# Patient Record
Sex: Female | Born: 1960
Health system: Southern US, Community
[De-identification: ages and names within clinical notes are randomized; demographics above are authoritative.]

## PROBLEM LIST (undated history)

## (undated) DIAGNOSIS — E119 Type 2 diabetes mellitus without complications: Secondary | ICD-10-CM

## (undated) HISTORY — DX: Type 2 diabetes mellitus without complications: E11.9

---

## 2006-03-26 ENCOUNTER — Encounter: Payer: Self-pay | Admitting: Pulmonary Disease

## 2007-01-21 ENCOUNTER — Encounter: Payer: Self-pay | Admitting: Pulmonary Disease

## 2007-02-24 ENCOUNTER — Encounter: Payer: Self-pay | Admitting: Pulmonary Disease

## 2007-03-10 ENCOUNTER — Encounter: Payer: Self-pay | Admitting: Pulmonary Disease

## 2007-08-06 ENCOUNTER — Encounter: Payer: Self-pay | Admitting: Pulmonary Disease

## 2007-08-06 ENCOUNTER — Other Ambulatory Visit: Admission: RE | Admit: 2007-08-06 | Discharge: 2007-08-06 | Payer: Self-pay | Admitting: Family Medicine

## 2007-08-26 ENCOUNTER — Encounter: Admission: RE | Admit: 2007-08-26 | Discharge: 2007-08-26 | Payer: Self-pay | Admitting: Emergency Medicine

## 2007-08-26 ENCOUNTER — Encounter: Payer: Self-pay | Admitting: Pulmonary Disease

## 2007-09-13 ENCOUNTER — Ambulatory Visit: Payer: Self-pay | Admitting: Pulmonary Disease

## 2007-09-14 ENCOUNTER — Encounter: Admission: RE | Admit: 2007-09-14 | Discharge: 2007-09-14 | Payer: Self-pay | Admitting: Family Medicine

## 2008-03-27 ENCOUNTER — Telehealth (INDEPENDENT_AMBULATORY_CARE_PROVIDER_SITE_OTHER): Payer: Self-pay | Admitting: *Deleted

## 2008-03-29 ENCOUNTER — Ambulatory Visit: Payer: Self-pay | Admitting: Pulmonary Disease

## 2008-03-29 LAB — CONVERTED CEMR LAB
BUN: 13 mg/dL (ref 6–23)
CO2: 23 meq/L (ref 19–32)
Chloride: 100 meq/L (ref 96–112)
GFR calc non Af Amer: 96 mL/min
Glucose, Bld: 262 mg/dL — ABNORMAL HIGH (ref 70–99)
Potassium: 4.3 meq/L (ref 3.5–5.1)
Sodium: 135 meq/L (ref 135–145)

## 2008-03-30 ENCOUNTER — Telehealth (INDEPENDENT_AMBULATORY_CARE_PROVIDER_SITE_OTHER): Payer: Self-pay | Admitting: *Deleted

## 2008-03-30 ENCOUNTER — Ambulatory Visit: Payer: Self-pay | Admitting: Cardiology

## 2008-04-04 ENCOUNTER — Ambulatory Visit: Payer: Self-pay | Admitting: Pulmonary Disease

## 2008-04-04 DIAGNOSIS — E119 Type 2 diabetes mellitus without complications: Secondary | ICD-10-CM | POA: Insufficient documentation

## 2008-04-04 DIAGNOSIS — R599 Enlarged lymph nodes, unspecified: Secondary | ICD-10-CM | POA: Insufficient documentation

## 2008-04-05 ENCOUNTER — Telehealth (INDEPENDENT_AMBULATORY_CARE_PROVIDER_SITE_OTHER): Payer: Self-pay | Admitting: *Deleted

## 2008-06-30 ENCOUNTER — Ambulatory Visit (HOSPITAL_COMMUNITY): Admission: RE | Admit: 2008-06-30 | Discharge: 2008-06-30 | Payer: Self-pay | Admitting: Surgery

## 2008-08-01 ENCOUNTER — Ambulatory Visit (HOSPITAL_COMMUNITY): Admission: RE | Admit: 2008-08-01 | Discharge: 2008-08-01 | Payer: Self-pay | Admitting: Surgery

## 2008-08-07 ENCOUNTER — Other Ambulatory Visit: Admission: RE | Admit: 2008-08-07 | Discharge: 2008-08-07 | Payer: Self-pay | Admitting: Family Medicine

## 2008-08-23 ENCOUNTER — Ambulatory Visit (HOSPITAL_COMMUNITY): Admission: RE | Admit: 2008-08-23 | Discharge: 2008-08-23 | Payer: Self-pay | Admitting: Surgery

## 2008-08-23 ENCOUNTER — Encounter (INDEPENDENT_AMBULATORY_CARE_PROVIDER_SITE_OTHER): Payer: Self-pay | Admitting: Surgery

## 2008-12-27 IMAGING — CT CT CHEST W/ CM
3 series · 18 of 29 positions shown, 19 images · IV contrast (75CC OMNI 300)
Comparison: Outside CT dated 02/24/07.

CLINICAL DATA: Hilar and mediastinal adenopathy.  Follow-up adenopathy.
CT CHEST WITH CONTRAST:
TECHNIQUE: Multidetector CT imaging of the chest was performed following the standard protocol during bolus administration of intravenous contrast.
Contrast:  75cc Omnipaque 300

[Series 2: routine chest · axial · 0.70mm/px · z∈[-158,-38]mm · 4 of 50 slices shown, 5 images]
[im 13/50  mediastinal]
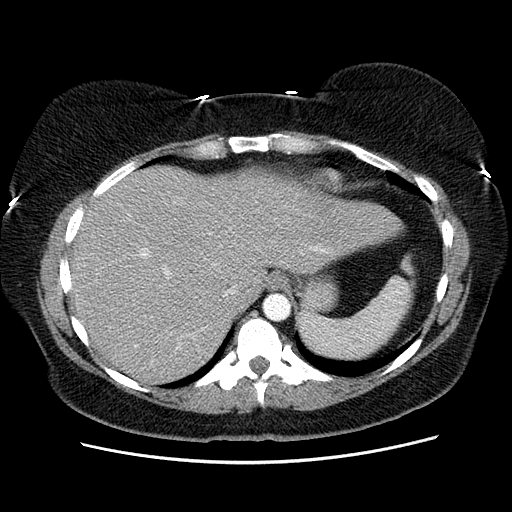
[im 13/50  lung]
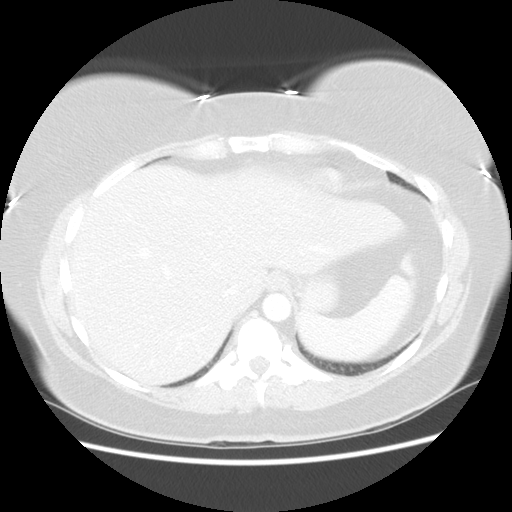
[im 25/50  lung]
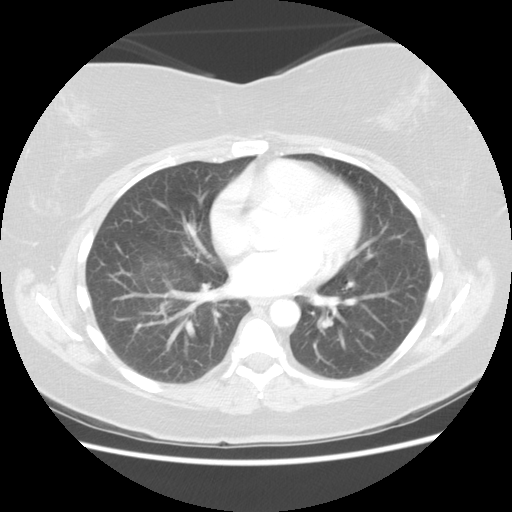
[im 30/50  lung]
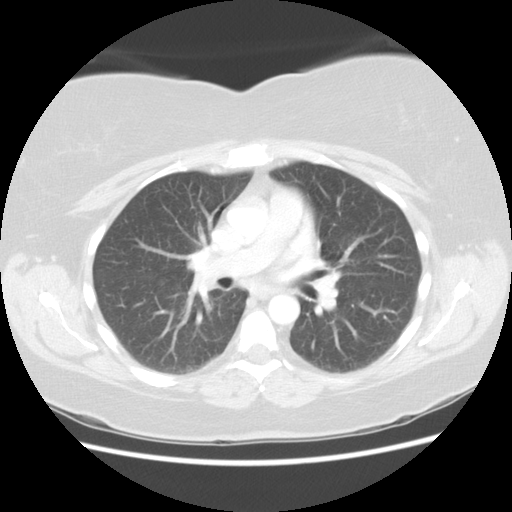
[im 37/50  lung]
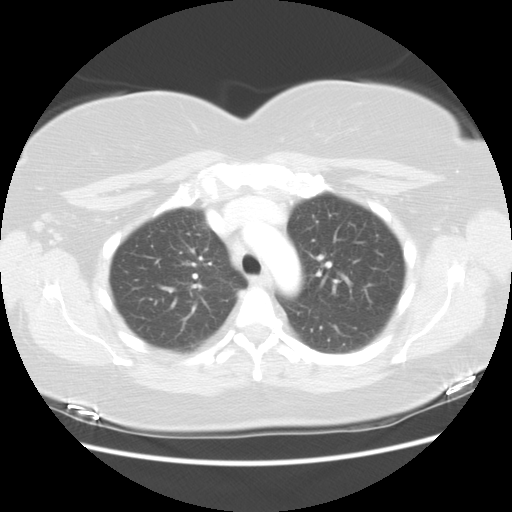

[Series 400: coronal · coronal · 0.70mm/px · 6 of 105 slices shown]
[im 12/105  lung]
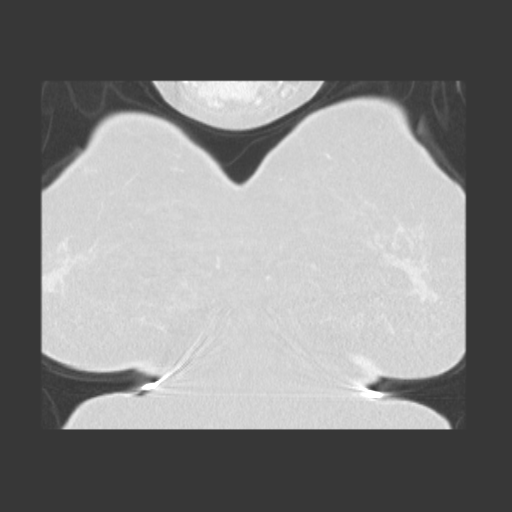
[im 24/105  lung]
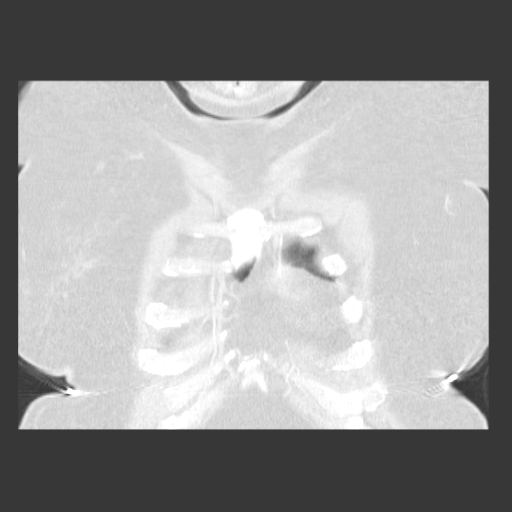
[im 35/105  lung]
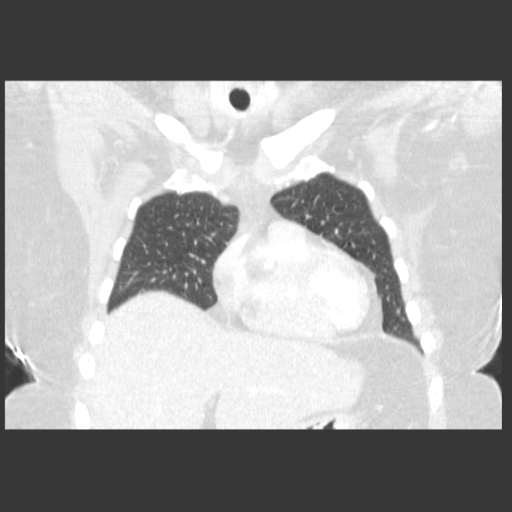
[im 47/105  lung]
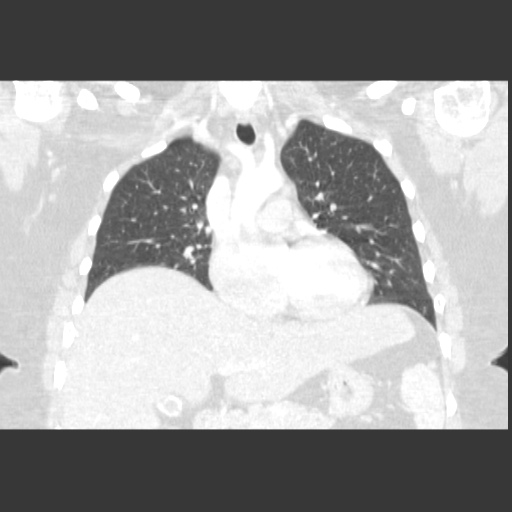
[im 58/105  lung]
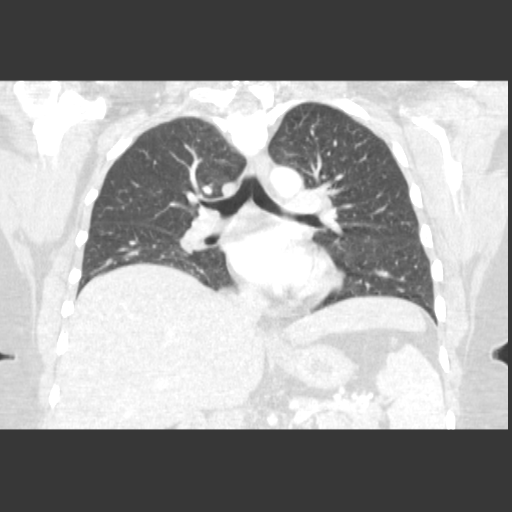
[im 70/105  lung]
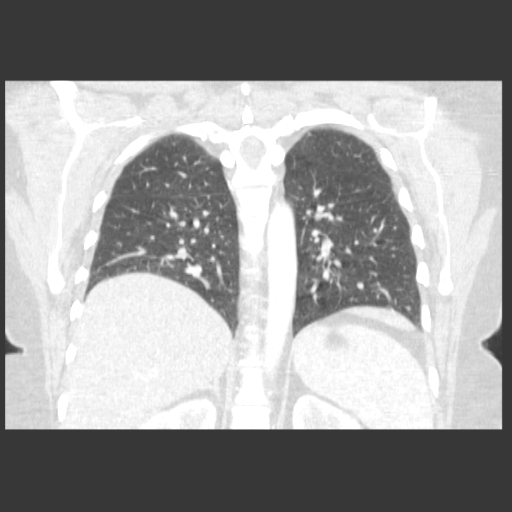

[Series 401: sagittal · sagittal · 0.70mm/px · 8 of 143 slices shown]
[im 12/143  lung]
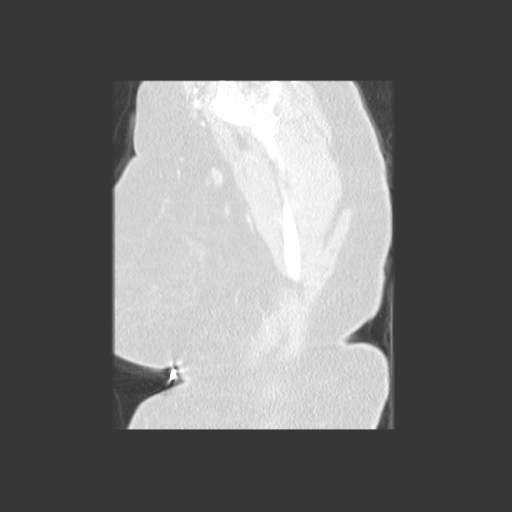
[im 36/143  lung]
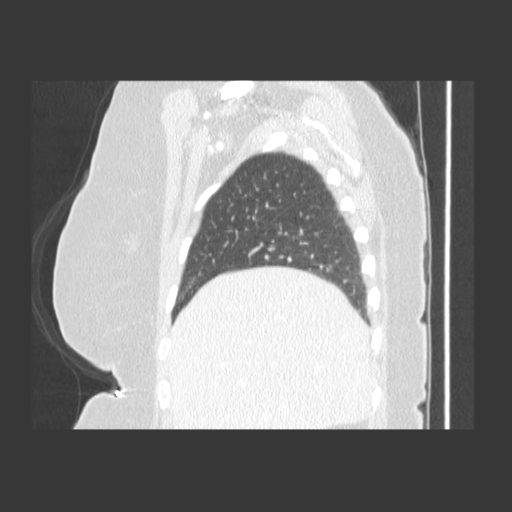
[im 48/143  lung]
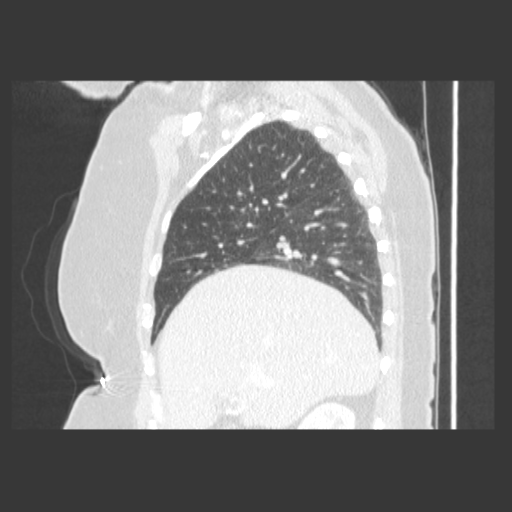
[im 60/143  lung]
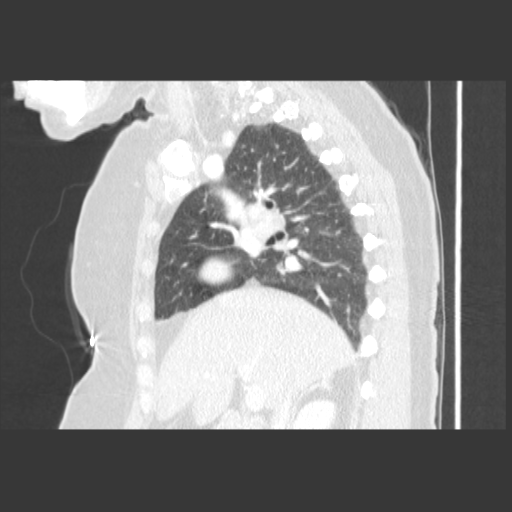
[im 83/143  lung]
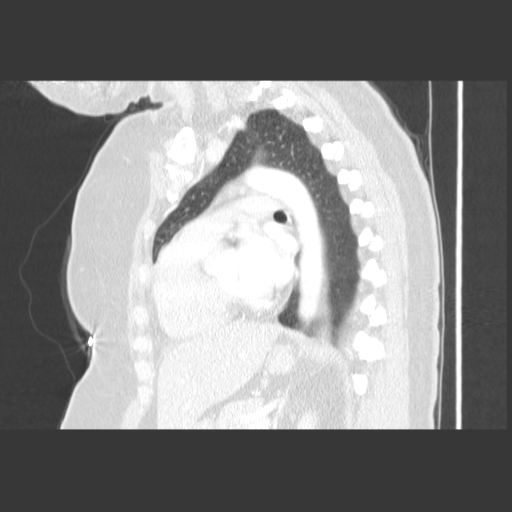
[im 95/143  lung]
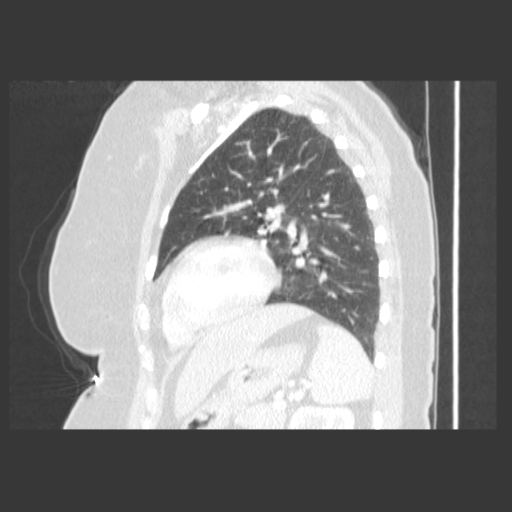
[im 107/143  lung]
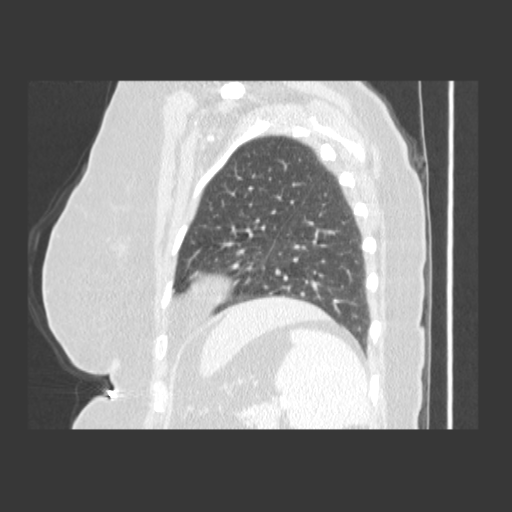
[im 131/143  lung]
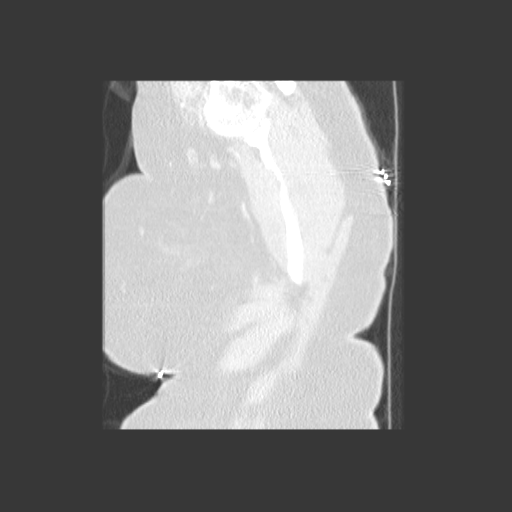

[18 of 29 positions shown; findings below may reference images not displayed]

FINDINGS: No pathologically enlarged axillary lymph nodes are identified.
There are no enlarged supraclavicular lymph nodes.  
Scattered borderline enlarged mediastinal lymph nodes are again identified.
For example, pretracheal lymph node measures 11.6 x 17.0 mm, image 18.  This is compared with 10 x 18 mm previously.
Prevascular lymph node measures 8 mm x 11.5 mm.  This is unchanged.
Right hilar lymph node measures 17.4 x 14.6 mm, image 24.  This is compared with 19 x 15 mm previously.
No pericardial or pleural fluid.
No suspicious pulmonary nodules or masses are noted. 
Review of the bone windows shows no suspicious lytic or sclerotic lesions.  Probable benign bone island is identified within the sternum, image 26.
Imaging through the upper abdomen shows gallstones.
IMPRESSION: 1.  Stable mediastinal and hilar adenopathy.  The differential diagnosis includes granulomatous disease such as sarcoid or infectious lymphadenopathy.  A neoplastic process such as lymphoma or metastatic disease is considered less favored, but not excluded. 
2.  Cholelithiasis.

## 2009-09-27 ENCOUNTER — Other Ambulatory Visit: Admission: RE | Admit: 2009-09-27 | Discharge: 2009-09-27 | Payer: Self-pay | Admitting: Family Medicine

## 2009-11-30 ENCOUNTER — Encounter: Admission: RE | Admit: 2009-11-30 | Discharge: 2009-11-30 | Payer: Self-pay | Admitting: Family Medicine

## 2010-10-08 ENCOUNTER — Other Ambulatory Visit
Admission: RE | Admit: 2010-10-08 | Discharge: 2010-10-08 | Payer: Self-pay | Source: Home / Self Care | Admitting: Family Medicine

## 2010-10-27 ENCOUNTER — Encounter: Payer: Self-pay | Admitting: Pulmonary Disease

## 2011-02-18 NOTE — Assessment & Plan Note (Signed)
[facility]                             PULMONARY OFFICE NOTE   Monica Maynard, Monica Maynard                          MRN:          [id number]  DATE:09/13/2007                            DOB:          13-Feb-1961    HISTORY OF PRESENT ILLNESS:  Monica Maynard is a 50 year old Caucasian woman  who is referred for evaluation of mediastinal lymphadenopathy.  She felt  sudden onset fatigue, myalgias, and swelling of the left ankle and lower  extremity.  Rheumatologist and vascular surgeon were consulted.  Workup  showed an ANA of 1 and 1280, but all other serologies were negative.  Duplex left lower extremity did not reveal any venous thrombosis.  CT  angiogram of the abdomen and pelvis did not show any evidence of  lymphadenopathy, but incidentally picked up lymphadenopathy in her  chest.  This was confirmed by a CT of the chest in May, 2008, which  showed mediastinal and bilateral hilar lymphadenopathy.  She saw a  pulmonologist.  Pulmonary function tests were essentially normal.  Of  note, due to abnormal chest x-ray in June 2007, she had undergone a  chest CT, which did not mention enlarged lymph nodes.  I have reviewed  these reports.   Monica Maynard, at present, does not have any symptoms of dyspnea, wheezing,  cough.  She denied joint pains.  Swelling in her left lower extremity  seemed to subside spontaneously.  She did not develop any skin rash,  fevers, weight loss, loss of appetites, night sweats.  She does not have  any family history of autoimmune disease.   PAST MEDICAL HISTORY:  Includes type 2 diabetes.   PAST SURGICAL HISTORY:  Bilateral carpal tunnel syndrome in 1994 and  1995.  Ankle ligament repair.  2 spontaneous vaginal deliveries.   ALLERGIES:  PROVERA caused a rash.   CURRENT MEDICATIONS:  Metformin.   SOCIAL HISTORY:  She quit smoking in 1982, smoked for about 4 years  about 5 to 6 cigarettes a day.  She is married and lives with her  husband.   She used to live in [place] before moving to [place].   REVIEW OF SYSTEMS:  Reports a dry, nonproductive cough.  Denies joint  pain, swelling.  Reports occasional stiffness in her joints.  Otherwise,  as above.   FAMILY HISTORY:  Mom is 2 and has severe COPD.  Father is 73 with early  signs of dementia.  1 sister has fibromyalgia.  Strong family history of  heart disease in paternal second degree relatives.   PHYSICAL EXAM:  Height 5 feet 3 inches, weight 185 pounds, temperature  98.3, blood pressure 112/70.  Heart rate 82 per minute, oxygen  saturation 97% on room air.  HEENT:  No post-nasal drip.  NECK:  Supple.  No JVD.  No lymphadenopathy.  CVS:  S1, S2 normal.  CHEST:  Clear to auscultation.  ABDOMEN:  Soft and nontender.  NEUROLOGIC:  Nonfocal.  EXTREMITIES:  No edema.   IMPRESSION:  1. Mediastinal lymphadenopathy of unclear etiology, stable since May  2008, but new compared to CT in June 2007.  2. Positive ANA.   The differential diagnoses of dyspnea with mediastinal lymphadenopathy  is broad and includes inflammatory etiology, such as collagen vascular  disease or granulomatous disease, such as sarcoidosis.  Malignancy seems  very unlikely in this nonsmoker.  I also noted that there was no  associated abdominal lymphadenopathy.  An ACE level is normal.  I would  favor an inflammatory etiology given the positive ANA and the joint  symptoms in the past.   RECOMMENDATIONS:  1. Followup CT test will be scheduled in 6 months' time.  If there is      any enlargement of the lymph nodes, we would pursue a      mediastinoscopy with biopsy.  Otherwise, we will just elect to      watch this.  2. Would be interesting to repeat an ANA to see if this has also      normalized with the resolution of her joint symptoms.  It seems      that a rheumatologic diagnosis was not established, but she had      pretty intensive workup and all of her serologies were normal, sed       rate and complement levels were normal.  She will return for a      followup CT scan in 6 months' time.  She will call us earlier if      symptoms develop.     Oretha Milch, MD  Electronically Signed    RVA/MedQ  DD: 09/13/2007  DT: 09/13/2007  Job #: [id number]   cc:   Carilyn Goodpasture, PA

## 2011-02-18 NOTE — Op Note (Signed)
NAMEKHRISTEN, CHEYNEY NO.:  [id number]   MEDICAL RECORD NO.:  [id number]          PATIENT TYPE:  AMB   LOCATION:  SDS                          FACILITY:  [facility]   PHYSICIAN:  Currie Paris, M.D.DATE OF BIRTH:  12/30/1960   DATE OF PROCEDURE:  08/23/2008  DATE OF DISCHARGE:  08/23/2008                               OPERATIVE REPORT   MEDICAL RECORD NUMBER [id number].   PREOPERATIVE DIAGNOSIS:  Cholelithiasis with apparent porcelain  gallbladder.   POSTOPERATIVE DIAGNOSIS:  Cholelithiasis with apparent porcelain  gallbladder.   OPERATION:  Laparoscopic cholecystectomy with operative cholangiogram.   SURGEON:  Currie Paris, MD   ASSISTANT:  Anselm Pancoast. Zachery Dakins, MD   ANESTHESIA:  General endotracheal.   CLINICAL HISTORY:  This is a 50 year old lady with gallstones and what  appeared to be a porcelain gallbladder and known diabetes.  She was  scheduled for cholecystectomy.   DESCRIPTION OF PROCEDURE:  The patient was seen in the holding area and  we reviewed the plans for surgery one more time.  She had no further  questions and was prepared to proceed.   The patient was taken to the operating room.  After satisfactory general  endotracheal anesthesia had been obtained, the abdomen was prepped and  draped.  A time-out was performed.   The area on the umbilicus was anesthetized with 0.25% plain Marcaine and  the umbilical incision made.  The fascia was identified, opened, a  pursestring placed, and the Hasson introduced under direct vision.  The  abdomen was insufflated to 15 and the camera placed.  There was no  obvious evidence of any abnormalities other than that the gallbladder  appeared somewhat contracted.   Using local for extend of our incision, a 10/11 trocar was placed in the  epigastrium and 5-mm trocars laterally.   The gallbladder was retracted over the liver, the perineum over the  cystic duct opened, and I dissected out a nice  long segment of cystic  duct.  There was a fair amount of chronic inflammation here and I did  not dissect into the cystic artery but made sure we had a nice long  section of cystic duct and could clearly see its junction with the  gallbladder as well as a cyst duct lymph node.   The duct was clipped once at the junction of the gallbladder and opened.  A Cook catheter was introduced percutaneously and intraoperative  angiogram done, which showed a normal common duct with good filling of  the duodenum and filling of the hepatic radicals.   Cystic duct catheter was removed and 3 clips placed on the stay side of  the cystic duct and it was divided.  The artery was dissected out,  clipped, and divided and the posterior branch was then dissected out,  clipped, and divided.  The gallbladder was then removed from below to  above with coagulation current of the cautery.  Once it was  disconnected, I irrigated and made sure everything was dry.  The  gallbladder was placed in a bag and brought out through  the umbilical  port.  I did leave a little Surgicel along the bed of the gallbladder.   After that was done, the lateral ports were removed and we made sure  there was no bleeding.  The umbilical port was removed and the  pursestring tied down.  The abdomen was desufflated through the  epigastric port and the skin then closed with 4-0 Monocryl subcuticular  plus Dermabond.   The patient tolerated the procedure well.  There were no complications.  All counts were correct.      Currie Paris, M.D.  Electronically Signed     CJS/MEDQ  D:  08/23/2008  T:  08/23/2008  Job:  [id number]   cc:   Otilio Connors. Gerri Spore, M.D.

## 2011-07-07 LAB — CBC
MCHC: 35
Platelets: 183
RBC: 4.53
RDW: 12.8

## 2011-07-07 LAB — DIFFERENTIAL
Basophils Absolute: 0
Basophils Relative: 1
Monocytes Absolute: 0.4
Monocytes Relative: 6
Neutro Abs: 3.7
Neutrophils Relative %: 61

## 2011-07-07 LAB — COMPREHENSIVE METABOLIC PANEL
ALT: 28
AST: 31
Albumin: 3.3 — ABNORMAL LOW
Alkaline Phosphatase: 57
BUN: 13
Chloride: 101
GFR calc Af Amer: >60
Sodium: 136
Total Bilirubin: 0.6

## 2011-07-07 LAB — LIPASE, BLOOD: Lipase: 24

## 2011-07-08 LAB — COMPREHENSIVE METABOLIC PANEL
ALT: 30
Albumin: 3.5
Albumin: 3.2 — ABNORMAL LOW
Alkaline Phosphatase: 47
CO2: 28
Calcium: 9.3
Chloride: 102
Creatinine, Ser: 0.67
Creatinine, Ser: 0.6
GFR calc Af Amer: >60
GFR calc non Af Amer: >60
Glucose, Bld: 139 — ABNORMAL HIGH
Potassium: 4.4
Total Protein: 6

## 2011-07-08 LAB — URINALYSIS, ROUTINE W REFLEX MICROSCOPIC
Bilirubin Urine: NEGATIVE
Glucose, UA: NEGATIVE
Hgb urine dipstick: NEGATIVE
Ketones, ur: 15 — AB
Ketones, ur: 15 — AB
Leukocytes, UA: NEGATIVE
Nitrite: NEGATIVE
Specific Gravity, Urine: 1.023
Specific Gravity, Urine: 1.024
Urobilinogen, UA: 0.2
pH: 5.5

## 2011-07-08 LAB — DIFFERENTIAL
Eosinophils Absolute: 0.1
Eosinophils Relative: 1
Eosinophils Relative: 1
Lymphs Abs: 2.5
Lymphs Abs: 2
Monocytes Absolute: 0.4
Monocytes Relative: 6
Neutro Abs: 4.3
Neutrophils Relative %: 59

## 2011-07-08 LAB — LIPASE, BLOOD: Lipase: 26

## 2011-07-08 LAB — URINE MICROSCOPIC-ADD ON

## 2011-07-08 LAB — CBC
HCT: 43.8
HCT: 43.1
Hemoglobin: 15.3 — ABNORMAL HIGH
MCHC: 34.6
MCV: 94.1
Platelets: 207
RDW: 12.1
WBC: 7.3

## 2011-07-08 LAB — GLUCOSE, CAPILLARY: Glucose-Capillary: 143 — ABNORMAL HIGH

## 2011-10-27 ENCOUNTER — Other Ambulatory Visit: Payer: Self-pay | Admitting: Family Medicine

## 2011-10-27 ENCOUNTER — Other Ambulatory Visit (HOSPITAL_COMMUNITY)
Admission: RE | Admit: 2011-10-27 | Discharge: 2011-10-27 | Disposition: A | Discharge status: Home / Self Care | Payer: Commercial Indemnity | Source: Ambulatory Visit | Attending: Family Medicine | Admitting: Family Medicine

## 2011-10-27 DIAGNOSIS — Z1159 Encounter for screening for other viral diseases: Secondary | ICD-10-CM | POA: Insufficient documentation

## 2011-10-27 DIAGNOSIS — Z124 Encounter for screening for malignant neoplasm of cervix: Secondary | ICD-10-CM | POA: Insufficient documentation

## 2012-01-23 ENCOUNTER — Other Ambulatory Visit: Payer: Self-pay | Admitting: Gastroenterology

## 2012-11-03 ENCOUNTER — Other Ambulatory Visit: Payer: Self-pay | Admitting: Family Medicine

## 2012-11-03 DIAGNOSIS — N6315 Unspecified lump in the right breast, overlapping quadrants: Secondary | ICD-10-CM

## 2012-11-16 ENCOUNTER — Ambulatory Visit
Admission: RE | Admit: 2012-11-16 | Discharge: 2012-11-16 | Disposition: A | Discharge status: Home / Self Care | Payer: Commercial Indemnity | Source: Ambulatory Visit | Attending: Family Medicine | Admitting: Family Medicine

## 2012-11-16 DIAGNOSIS — N6315 Unspecified lump in the right breast, overlapping quadrants: Secondary | ICD-10-CM

## 2013-05-18 ENCOUNTER — Other Ambulatory Visit: Payer: Self-pay | Admitting: Family Medicine

## 2013-05-18 DIAGNOSIS — R202 Paresthesia of skin: Secondary | ICD-10-CM

## 2013-05-19 ENCOUNTER — Ambulatory Visit
Admission: RE | Admit: 2013-05-19 | Discharge: 2013-05-19 | Disposition: A | Discharge status: Home / Self Care | Payer: Commercial Indemnity | Source: Ambulatory Visit | Attending: Family Medicine | Admitting: Family Medicine

## 2013-05-19 DIAGNOSIS — R202 Paresthesia of skin: Secondary | ICD-10-CM

## 2014-10-13 ENCOUNTER — Ambulatory Visit: Payer: BC Managed Care – PPO

## 2014-12-01 ENCOUNTER — Encounter: Payer: BLUE CROSS/BLUE SHIELD | Attending: Family Medicine | Admitting: *Deleted

## 2014-12-01 ENCOUNTER — Encounter: Payer: Self-pay | Admitting: *Deleted

## 2014-12-01 VITALS — Ht 62.5 in | Wt 168.2 lb

## 2014-12-01 DIAGNOSIS — Z713 Dietary counseling and surveillance: Secondary | ICD-10-CM | POA: Diagnosis present

## 2014-12-01 DIAGNOSIS — Z8632 Personal history of gestational diabetes: Secondary | ICD-10-CM | POA: Insufficient documentation

## 2014-12-01 DIAGNOSIS — E118 Type 2 diabetes mellitus with unspecified complications: Secondary | ICD-10-CM

## 2014-12-01 DIAGNOSIS — E119 Type 2 diabetes mellitus without complications: Secondary | ICD-10-CM | POA: Insufficient documentation

## 2014-12-01 DIAGNOSIS — E282 Polycystic ovarian syndrome: Secondary | ICD-10-CM | POA: Insufficient documentation

## 2014-12-06 NOTE — Patient Instructions (Signed)
Plan:  Aim for 2 Carb Choices per meal (30 grams) +/- 1 either way  Aim for 0-1 Carbs per snack if hungry  Include protein in moderation with your meals and snacks Consider reading food labels for Total Carbohydrate of foods Consider  increasing your activity level daily as tolerated Consider checking BG at alternate times per day   Consider taking medication as directed by MD

## 2014-12-06 NOTE — Progress Notes (Signed)
Diabetes Self-Management Education  Visit Type:  Initial  Appt. Start Time: 1030 Appt. End Time: 1200  12/06/2014  [name], identified by name and date of birth, is a 54 y.o. female with a diagnosis of Diabetes: Type 2 (States history of GDM with 2 pregnancies and now diagnosed with PCOS.).  Other people present during visit:  Patient   ASSESSMENT  Height 5' 2.5" (1.588 m), weight 168 lb 3.2 oz (76.295 kg). Body mass index is 30.25 kg/(m^2).  Initial Visit Information:  Are you currently following a meal plan?: Yes What type of meal plan do you follow?: Balanced meals, 3 meals a day with occasional snacks Are you taking your medications as prescribed?: Yes Are you checking your feet?: Yes How many days per week are you checking your feet?: 7 How often do you need to have someone help you when you read instructions, pamphlets, or other written materials from your doctor or pharmacy?: 1 - Never    Psychosocial:     Patient Belief/Attitude about Diabetes: Defeat/Burnout Self-management support: Doctor's office Other persons present: Patient Preferred Learning Style: Visual, Hands on Learning Readiness: Change in progress  Complications:   Last HgB A1C per patient/outside source: 7.2 mg/dL How often do you check your blood sugar?: 3-4 times/day Number of hypoglycemic episodes per month: 0 Have you had a dilated eye exam in the past 12 months?: Yes Have you had a dental exam in the past 12 months?: Yes  Diet Intake:  Breakfast: scrambled egg, bacon, whole wheat English Muffin, butter, All Fruit spread, coffee with Splenda OR 1/2 biscuit on way to work with ham or egg and cheese Snack (morning): not usually, maybe raw vegetables OR trail mix without any fruit Lunch: brings from home, meat and cheese sandwich on whole wheat bread, raw vegetables, water or diet soda Snack (afternoon): glass of skim milk when home from work Dinner: [employer], vegetables and a starch OR  casserole type meal OR grilled cheese Snack (evening): not usually Beverage(s): coffee, water or diet soda  Exercise:  Exercise: Light (walking / raking leaves) Light Exercise amount of time (min / week): 30  Individualized Plan for Diabetes Self-Management Training:   Learning Objective:  Patient will have a greater understanding of diabetes self-management.  Patient education plan per assessed needs and concerns is to attend individual sessions for     Education Topics Reviewed with Patient Today:  Definition of diabetes, type 1 and 2, and the diagnosis of diabetes Role of diet in the treatment of diabetes and the relationship between the three main macronutrients and blood glucose level, Carbohydrate counting Role of exercise on diabetes management, blood pressure control and cardiac health. Reviewed patients medication for diabetes, action, purpose, timing of dose and side effects., Other (comment) (also discussed insulin action and advantages of insulin over some medications that have potentially significant side effects) Identified appropriate SMBG and/or A1C goals.     Role of stress on diabetes      PATIENTS GOALS/Plan (Developed by the patient):  Nutrition: Follow meal plan discussed Physical Activity: 15 minutes per day Medications: take my medication as prescribed, Other (comment) (Discuss options with your MD)  Plan:   Patient Instructions  Plan:  Aim for 2 Carb Choices per meal (30 grams) +/- 1 either way  Aim for 0-1 Carbs per snack if hungry  Include protein in moderation with your meals and snacks Consider reading food labels for Total Carbohydrate of foods Consider  increasing your activity level  daily as tolerated Consider checking BG at alternate times per day   Consider taking medication as directed by MD      Expected Outcomes:  Demonstrated interest in learning. Expect positive outcomes  Education material provided: Living Well with Diabetes,  A1C conversion sheet, Meal plan card and Support group flyer, DM Medication Handout  If problems or questions, patient to contact team via:  Phone and Email  Future DSME appointment: 3-4 months

## 2014-12-29 ENCOUNTER — Ambulatory Visit: Payer: Commercial Indemnity | Admitting: *Deleted

## 2015-01-05 ENCOUNTER — Ambulatory Visit: Payer: Commercial Indemnity | Admitting: *Deleted

## 2015-01-11 ENCOUNTER — Encounter: Payer: BLUE CROSS/BLUE SHIELD | Attending: Family Medicine | Admitting: *Deleted

## 2015-01-11 VITALS — Ht 62.5 in | Wt 167.5 lb

## 2015-01-11 DIAGNOSIS — E282 Polycystic ovarian syndrome: Secondary | ICD-10-CM | POA: Diagnosis not present

## 2015-01-11 DIAGNOSIS — Z8632 Personal history of gestational diabetes: Secondary | ICD-10-CM | POA: Insufficient documentation

## 2015-01-11 DIAGNOSIS — Z713 Dietary counseling and surveillance: Secondary | ICD-10-CM | POA: Insufficient documentation

## 2015-01-11 DIAGNOSIS — E119 Type 2 diabetes mellitus without complications: Secondary | ICD-10-CM | POA: Insufficient documentation

## 2015-01-11 DIAGNOSIS — E118 Type 2 diabetes mellitus with unspecified complications: Secondary | ICD-10-CM

## 2015-01-23 NOTE — Patient Instructions (Signed)
Plan:  Aim for 2 Carb Choices per meal (30 grams) +/- 1 either way  Aim for 0-1 Carbs per snack if hungry  Include protein in moderation with your meals and snacks Consider reading food labels for Total Carbohydrate of foods Consider  increasing your activity level daily as tolerated Consider checking BG at alternate times per day   Consider taking medication as directed by MD

## 2015-01-23 NOTE — Progress Notes (Signed)
Diabetes Self-Management Education  Visit Type:   Follow Up  Appt. Start Time: 1530 Appt. End Time: 1600  01/23/2015  Ms. Fanny SkatesSandy Pawelski, identified by name and date of birth, is a 54 y.o. female with a diagnosis of Diabetes:  .  Other people present during visit:  Patient   ASSESSMENT  Height 5' 2.5" (1.588 m), weight 167 lb 8 oz (75.978 kg). Body mass index is 30.13 kg/(m^2).    Subsequent Visit Information:  Since your last visit have you continued or begun to take your medications as prescribed?: Yes (new Diabetes medication added, Victoza) Since your last visit have you experienced any weight changes?: Loss Weight Loss (lbs): 1 Since your last visit, are you checking your blood glucose at least once a day?: Yes  Psychosocial:     Other persons present: Patient Patient Concerns: Medication  Complications:   Fasting Blood glucose range (mg/dL): >161>200  Diet Intake:     Exercise:      Individualized Plan for Diabetes Self-Management Training:   Learning Objective:  Patient will have a greater understanding of diabetes self-management.  Patient education plan per assessed needs and concerns is to attend individual sessions  Education Topics Reviewed with Patient Today:        Reviewed medication adjustment guidelines for hyperglycemia and sick days. (Discussed action of Victoza. Also discusses insulin aciton, lactk of side effects, and multiple potential benefits of insulin over some of the DM medications ) Purpose and frequency of SMBG.            PATIENTS GOALS/Plan (Developed by the patient):  Medications: take my medication as prescribed  Patient Self Evaluation of Goals - Patient rates self as meeting previously set goals:   Nutrition: 50 - 75 % Medications: >75% Monitoring: >75%   Plan:   Patient Instructions  Plan:  Aim for 2 Carb Choices per meal (30 grams) +/- 1 either way  Aim for 0-1 Carbs per snack if hungry  Include protein in  moderation with your meals and snacks Consider reading food labels for Total Carbohydrate of foods Consider  increasing your activity level daily as tolerated Consider checking BG at alternate times per day   Consider taking medication as directed by MD       Expected Outcomes:  Demonstrated interest in learning. Expect positive outcomes  Education material provided: DM Medication List, Insulin Action handout  If problems or questions, patient to contact team via:  Phone and Email  Future DSME appointment: - PRN

## 2015-05-17 ENCOUNTER — Other Ambulatory Visit: Payer: Self-pay

## 2015-05-17 DIAGNOSIS — Z1231 Encounter for screening mammogram for malignant neoplasm of breast: Secondary | ICD-10-CM

## 2015-05-18 ENCOUNTER — Ambulatory Visit
Admission: RE | Admit: 2015-05-18 | Discharge: 2015-05-18 | Disposition: A | Discharge status: Home / Self Care | Payer: BLUE CROSS/BLUE SHIELD | Source: Ambulatory Visit

## 2015-05-18 DIAGNOSIS — Z1231 Encounter for screening mammogram for malignant neoplasm of breast: Secondary | ICD-10-CM

## 2016-03-06 DIAGNOSIS — J069 Acute upper respiratory infection, unspecified: Secondary | ICD-10-CM | POA: Diagnosis not present

## 2016-03-21 DIAGNOSIS — Z794 Long term (current) use of insulin: Secondary | ICD-10-CM | POA: Diagnosis not present

## 2016-03-21 DIAGNOSIS — E669 Obesity, unspecified: Secondary | ICD-10-CM | POA: Diagnosis not present

## 2016-03-21 DIAGNOSIS — E119 Type 2 diabetes mellitus without complications: Secondary | ICD-10-CM | POA: Diagnosis not present

## 2016-04-18 DIAGNOSIS — D509 Iron deficiency anemia, unspecified: Secondary | ICD-10-CM | POA: Diagnosis not present

## 2016-05-08 DIAGNOSIS — E113293 Type 2 diabetes mellitus with mild nonproliferative diabetic retinopathy without macular edema, bilateral: Secondary | ICD-10-CM | POA: Diagnosis not present

## 2016-05-28 DIAGNOSIS — Z01818 Encounter for other preprocedural examination: Secondary | ICD-10-CM | POA: Diagnosis not present

## 2016-05-28 DIAGNOSIS — Z86010 Personal history of colonic polyps: Secondary | ICD-10-CM | POA: Diagnosis not present

## 2016-05-28 DIAGNOSIS — D509 Iron deficiency anemia, unspecified: Secondary | ICD-10-CM | POA: Diagnosis not present

## 2016-06-13 DIAGNOSIS — Z86010 Personal history of colonic polyps: Secondary | ICD-10-CM | POA: Diagnosis not present

## 2016-06-13 DIAGNOSIS — K5730 Diverticulosis of large intestine without perforation or abscess without bleeding: Secondary | ICD-10-CM | POA: Diagnosis not present

## 2016-06-13 DIAGNOSIS — D509 Iron deficiency anemia, unspecified: Secondary | ICD-10-CM | POA: Diagnosis not present

## 2016-08-22 DIAGNOSIS — F418 Other specified anxiety disorders: Secondary | ICD-10-CM | POA: Diagnosis not present

## 2016-08-22 DIAGNOSIS — D509 Iron deficiency anemia, unspecified: Secondary | ICD-10-CM | POA: Diagnosis not present

## 2016-10-03 DIAGNOSIS — E119 Type 2 diabetes mellitus without complications: Secondary | ICD-10-CM | POA: Diagnosis not present

## 2016-10-10 DIAGNOSIS — Z794 Long term (current) use of insulin: Secondary | ICD-10-CM | POA: Diagnosis not present

## 2016-10-10 DIAGNOSIS — E669 Obesity, unspecified: Secondary | ICD-10-CM | POA: Diagnosis not present

## 2016-10-10 DIAGNOSIS — E119 Type 2 diabetes mellitus without complications: Secondary | ICD-10-CM | POA: Diagnosis not present

## 2016-10-24 DIAGNOSIS — D509 Iron deficiency anemia, unspecified: Secondary | ICD-10-CM | POA: Diagnosis not present

## 2016-11-13 ENCOUNTER — Other Ambulatory Visit (HOSPITAL_COMMUNITY)
Admission: RE | Admit: 2016-11-13 | Discharge: 2016-11-13 | Disposition: A | Discharge status: Home / Self Care | Payer: BLUE CROSS/BLUE SHIELD | Source: Ambulatory Visit | Attending: Obstetrics & Gynecology | Admitting: Obstetrics & Gynecology

## 2016-11-13 ENCOUNTER — Other Ambulatory Visit: Payer: Self-pay | Admitting: Obstetrics & Gynecology

## 2016-11-13 DIAGNOSIS — Z01419 Encounter for gynecological examination (general) (routine) without abnormal findings: Secondary | ICD-10-CM | POA: Insufficient documentation

## 2016-11-13 DIAGNOSIS — Z113 Encounter for screening for infections with a predominantly sexual mode of transmission: Secondary | ICD-10-CM | POA: Diagnosis not present

## 2016-11-13 DIAGNOSIS — Z1151 Encounter for screening for human papillomavirus (HPV): Secondary | ICD-10-CM | POA: Insufficient documentation

## 2016-11-13 DIAGNOSIS — Z01411 Encounter for gynecological examination (general) (routine) with abnormal findings: Secondary | ICD-10-CM | POA: Diagnosis not present

## 2016-11-19 LAB — CYTOLOGY - PAP
BACTERIAL VAGINITIS: NEGATIVE
Candida vaginitis: NEGATIVE
Diagnosis: NEGATIVE
HPV (WINDOPATH): NOT DETECTED
Trichomonas: NEGATIVE

## 2017-02-13 DIAGNOSIS — D509 Iron deficiency anemia, unspecified: Secondary | ICD-10-CM | POA: Diagnosis not present

## 2017-03-20 DIAGNOSIS — M79646 Pain in unspecified finger(s): Secondary | ICD-10-CM | POA: Diagnosis not present

## 2017-03-20 DIAGNOSIS — M25512 Pain in left shoulder: Secondary | ICD-10-CM | POA: Diagnosis not present

## 2017-03-26 DIAGNOSIS — M25512 Pain in left shoulder: Secondary | ICD-10-CM | POA: Diagnosis not present

## 2017-03-30 DIAGNOSIS — M25512 Pain in left shoulder: Secondary | ICD-10-CM | POA: Diagnosis not present

## 2017-03-31 DIAGNOSIS — M25512 Pain in left shoulder: Secondary | ICD-10-CM | POA: Diagnosis not present

## 2017-04-03 DIAGNOSIS — M19012 Primary osteoarthritis, left shoulder: Secondary | ICD-10-CM | POA: Diagnosis not present

## 2017-04-03 DIAGNOSIS — M7502 Adhesive capsulitis of left shoulder: Secondary | ICD-10-CM | POA: Diagnosis not present

## 2017-04-09 DIAGNOSIS — M7502 Adhesive capsulitis of left shoulder: Secondary | ICD-10-CM | POA: Diagnosis not present

## 2017-04-14 DIAGNOSIS — M7502 Adhesive capsulitis of left shoulder: Secondary | ICD-10-CM | POA: Diagnosis not present

## 2017-04-16 DIAGNOSIS — M7502 Adhesive capsulitis of left shoulder: Secondary | ICD-10-CM | POA: Diagnosis not present

## 2017-04-17 DIAGNOSIS — Z794 Long term (current) use of insulin: Secondary | ICD-10-CM | POA: Diagnosis not present

## 2017-04-17 DIAGNOSIS — E669 Obesity, unspecified: Secondary | ICD-10-CM | POA: Diagnosis not present

## 2017-04-17 DIAGNOSIS — E119 Type 2 diabetes mellitus without complications: Secondary | ICD-10-CM | POA: Diagnosis not present

## 2017-04-21 DIAGNOSIS — M7502 Adhesive capsulitis of left shoulder: Secondary | ICD-10-CM | POA: Diagnosis not present

## 2017-04-23 DIAGNOSIS — M7502 Adhesive capsulitis of left shoulder: Secondary | ICD-10-CM | POA: Diagnosis not present

## 2017-04-24 DIAGNOSIS — M65311 Trigger thumb, right thumb: Secondary | ICD-10-CM | POA: Diagnosis not present

## 2017-04-24 DIAGNOSIS — M65312 Trigger thumb, left thumb: Secondary | ICD-10-CM | POA: Diagnosis not present

## 2017-04-24 DIAGNOSIS — M65342 Trigger finger, left ring finger: Secondary | ICD-10-CM | POA: Diagnosis not present

## 2017-04-28 DIAGNOSIS — M7502 Adhesive capsulitis of left shoulder: Secondary | ICD-10-CM | POA: Diagnosis not present

## 2017-05-01 DIAGNOSIS — M7502 Adhesive capsulitis of left shoulder: Secondary | ICD-10-CM | POA: Diagnosis not present

## 2017-05-01 DIAGNOSIS — M19012 Primary osteoarthritis, left shoulder: Secondary | ICD-10-CM | POA: Diagnosis not present

## 2017-05-11 DIAGNOSIS — H52223 Regular astigmatism, bilateral: Secondary | ICD-10-CM | POA: Diagnosis not present

## 2017-05-11 DIAGNOSIS — H5213 Myopia, bilateral: Secondary | ICD-10-CM | POA: Diagnosis not present

## 2017-05-11 DIAGNOSIS — H524 Presbyopia: Secondary | ICD-10-CM | POA: Diagnosis not present

## 2017-05-25 DIAGNOSIS — M65311 Trigger thumb, right thumb: Secondary | ICD-10-CM | POA: Diagnosis not present

## 2017-05-25 DIAGNOSIS — M65312 Trigger thumb, left thumb: Secondary | ICD-10-CM | POA: Diagnosis not present

## 2017-05-25 DIAGNOSIS — M65342 Trigger finger, left ring finger: Secondary | ICD-10-CM | POA: Diagnosis not present

## 2017-06-22 DIAGNOSIS — M65311 Trigger thumb, right thumb: Secondary | ICD-10-CM | POA: Diagnosis not present

## 2017-06-22 DIAGNOSIS — M65342 Trigger finger, left ring finger: Secondary | ICD-10-CM | POA: Diagnosis not present

## 2017-06-22 DIAGNOSIS — M65312 Trigger thumb, left thumb: Secondary | ICD-10-CM | POA: Diagnosis not present

## 2017-07-20 DIAGNOSIS — M65312 Trigger thumb, left thumb: Secondary | ICD-10-CM | POA: Diagnosis not present

## 2017-07-20 DIAGNOSIS — M65311 Trigger thumb, right thumb: Secondary | ICD-10-CM | POA: Diagnosis not present

## 2017-07-20 DIAGNOSIS — M65342 Trigger finger, left ring finger: Secondary | ICD-10-CM | POA: Diagnosis not present

## 2017-10-09 DIAGNOSIS — M65311 Trigger thumb, right thumb: Secondary | ICD-10-CM | POA: Diagnosis not present

## 2017-10-09 DIAGNOSIS — M65312 Trigger thumb, left thumb: Secondary | ICD-10-CM | POA: Diagnosis not present

## 2017-10-09 DIAGNOSIS — M65342 Trigger finger, left ring finger: Secondary | ICD-10-CM | POA: Diagnosis not present

## 2017-10-09 DIAGNOSIS — M65331 Trigger finger, right middle finger: Secondary | ICD-10-CM | POA: Diagnosis not present

## 2017-10-20 ENCOUNTER — Other Ambulatory Visit: Payer: Self-pay | Admitting: Obstetrics & Gynecology

## 2017-10-20 DIAGNOSIS — Z139 Encounter for screening, unspecified: Secondary | ICD-10-CM

## 2017-10-23 DIAGNOSIS — E119 Type 2 diabetes mellitus without complications: Secondary | ICD-10-CM | POA: Diagnosis not present

## 2017-10-23 DIAGNOSIS — Z794 Long term (current) use of insulin: Secondary | ICD-10-CM | POA: Diagnosis not present

## 2017-10-23 DIAGNOSIS — E669 Obesity, unspecified: Secondary | ICD-10-CM | POA: Diagnosis not present

## 2017-10-23 DIAGNOSIS — Z5181 Encounter for therapeutic drug level monitoring: Secondary | ICD-10-CM | POA: Diagnosis not present

## 2017-10-23 DIAGNOSIS — Z79899 Other long term (current) drug therapy: Secondary | ICD-10-CM | POA: Diagnosis not present

## 2017-10-23 DIAGNOSIS — Z8639 Personal history of other endocrine, nutritional and metabolic disease: Secondary | ICD-10-CM | POA: Diagnosis not present

## 2017-11-06 ENCOUNTER — Ambulatory Visit
Admission: RE | Admit: 2017-11-06 | Discharge: 2017-11-06 | Disposition: A | Discharge status: Home / Self Care | Payer: BLUE CROSS/BLUE SHIELD | Source: Ambulatory Visit | Attending: Obstetrics & Gynecology | Admitting: Obstetrics & Gynecology

## 2017-11-06 DIAGNOSIS — Z1231 Encounter for screening mammogram for malignant neoplasm of breast: Secondary | ICD-10-CM | POA: Diagnosis not present

## 2017-11-06 DIAGNOSIS — Z139 Encounter for screening, unspecified: Secondary | ICD-10-CM

## 2017-11-12 DIAGNOSIS — H40013 Open angle with borderline findings, low risk, bilateral: Secondary | ICD-10-CM | POA: Diagnosis not present

## 2017-11-12 DIAGNOSIS — H353131 Nonexudative age-related macular degeneration, bilateral, early dry stage: Secondary | ICD-10-CM | POA: Diagnosis not present

## 2017-11-12 DIAGNOSIS — E119 Type 2 diabetes mellitus without complications: Secondary | ICD-10-CM | POA: Diagnosis not present

## 2017-11-17 DIAGNOSIS — Z01419 Encounter for gynecological examination (general) (routine) without abnormal findings: Secondary | ICD-10-CM | POA: Diagnosis not present

## 2017-11-25 ENCOUNTER — Other Ambulatory Visit: Payer: Self-pay

## 2017-11-25 DIAGNOSIS — M24542 Contracture, left hand: Secondary | ICD-10-CM | POA: Diagnosis not present

## 2017-11-25 DIAGNOSIS — M65312 Trigger thumb, left thumb: Secondary | ICD-10-CM | POA: Diagnosis not present

## 2017-11-25 DIAGNOSIS — M720 Palmar fascial fibromatosis [Dupuytren]: Secondary | ICD-10-CM | POA: Diagnosis not present

## 2017-11-25 DIAGNOSIS — M65342 Trigger finger, left ring finger: Secondary | ICD-10-CM | POA: Diagnosis not present

## 2017-11-25 DIAGNOSIS — M65332 Trigger finger, left middle finger: Secondary | ICD-10-CM | POA: Diagnosis not present

## 2017-11-25 DIAGNOSIS — M728 Other fibroblastic disorders: Secondary | ICD-10-CM | POA: Diagnosis not present

## 2018-01-14 DIAGNOSIS — Z0000 Encounter for general adult medical examination without abnormal findings: Secondary | ICD-10-CM | POA: Diagnosis not present

## 2018-02-18 DIAGNOSIS — M79642 Pain in left hand: Secondary | ICD-10-CM | POA: Diagnosis not present

## 2018-03-04 DIAGNOSIS — M79642 Pain in left hand: Secondary | ICD-10-CM | POA: Diagnosis not present

## 2018-03-11 DIAGNOSIS — M79642 Pain in left hand: Secondary | ICD-10-CM | POA: Diagnosis not present

## 2018-03-12 DIAGNOSIS — M65312 Trigger thumb, left thumb: Secondary | ICD-10-CM | POA: Diagnosis not present

## 2018-03-12 DIAGNOSIS — M65342 Trigger finger, left ring finger: Secondary | ICD-10-CM | POA: Diagnosis not present

## 2018-03-12 DIAGNOSIS — M65311 Trigger thumb, right thumb: Secondary | ICD-10-CM | POA: Diagnosis not present

## 2018-03-12 DIAGNOSIS — M65332 Trigger finger, left middle finger: Secondary | ICD-10-CM | POA: Diagnosis not present

## 2018-04-01 DIAGNOSIS — M79642 Pain in left hand: Secondary | ICD-10-CM | POA: Diagnosis not present

## 2018-04-07 DIAGNOSIS — M79642 Pain in left hand: Secondary | ICD-10-CM | POA: Diagnosis not present

## 2018-04-15 DIAGNOSIS — M79642 Pain in left hand: Secondary | ICD-10-CM | POA: Diagnosis not present

## 2018-04-22 DIAGNOSIS — M79642 Pain in left hand: Secondary | ICD-10-CM | POA: Diagnosis not present

## 2018-04-23 DIAGNOSIS — E669 Obesity, unspecified: Secondary | ICD-10-CM | POA: Diagnosis not present

## 2018-04-23 DIAGNOSIS — Z794 Long term (current) use of insulin: Secondary | ICD-10-CM | POA: Diagnosis not present

## 2018-04-23 DIAGNOSIS — Z5181 Encounter for therapeutic drug level monitoring: Secondary | ICD-10-CM | POA: Diagnosis not present

## 2018-04-23 DIAGNOSIS — E119 Type 2 diabetes mellitus without complications: Secondary | ICD-10-CM | POA: Diagnosis not present

## 2018-04-29 DIAGNOSIS — M79642 Pain in left hand: Secondary | ICD-10-CM | POA: Diagnosis not present

## 2018-05-13 DIAGNOSIS — M79642 Pain in left hand: Secondary | ICD-10-CM | POA: Diagnosis not present

## 2018-05-20 DIAGNOSIS — M79642 Pain in left hand: Secondary | ICD-10-CM | POA: Diagnosis not present

## 2018-05-27 DIAGNOSIS — M79642 Pain in left hand: Secondary | ICD-10-CM | POA: Diagnosis not present

## 2018-06-10 DIAGNOSIS — M79642 Pain in left hand: Secondary | ICD-10-CM | POA: Diagnosis not present

## 2018-06-17 DIAGNOSIS — M79642 Pain in left hand: Secondary | ICD-10-CM | POA: Diagnosis not present

## 2018-06-24 DIAGNOSIS — M79642 Pain in left hand: Secondary | ICD-10-CM | POA: Diagnosis not present

## 2018-07-06 DIAGNOSIS — M25511 Pain in right shoulder: Secondary | ICD-10-CM | POA: Diagnosis not present

## 2018-07-06 DIAGNOSIS — M7551 Bursitis of right shoulder: Secondary | ICD-10-CM | POA: Diagnosis not present

## 2018-08-23 DIAGNOSIS — L821 Other seborrheic keratosis: Secondary | ICD-10-CM | POA: Diagnosis not present

## 2018-08-23 DIAGNOSIS — D225 Melanocytic nevi of trunk: Secondary | ICD-10-CM | POA: Diagnosis not present

## 2018-08-23 DIAGNOSIS — K130 Diseases of lips: Secondary | ICD-10-CM | POA: Diagnosis not present

## 2018-08-23 DIAGNOSIS — L400 Psoriasis vulgaris: Secondary | ICD-10-CM | POA: Diagnosis not present

## 2018-10-04 DIAGNOSIS — L400 Psoriasis vulgaris: Secondary | ICD-10-CM | POA: Diagnosis not present

## 2018-10-04 DIAGNOSIS — K130 Diseases of lips: Secondary | ICD-10-CM | POA: Diagnosis not present

## 2018-10-22 DIAGNOSIS — Z79899 Other long term (current) drug therapy: Secondary | ICD-10-CM | POA: Diagnosis not present

## 2018-10-22 DIAGNOSIS — E669 Obesity, unspecified: Secondary | ICD-10-CM | POA: Diagnosis not present

## 2018-10-22 DIAGNOSIS — Z794 Long term (current) use of insulin: Secondary | ICD-10-CM | POA: Diagnosis not present

## 2018-10-22 DIAGNOSIS — E119 Type 2 diabetes mellitus without complications: Secondary | ICD-10-CM | POA: Diagnosis not present

## 2018-11-18 DIAGNOSIS — Z01419 Encounter for gynecological examination (general) (routine) without abnormal findings: Secondary | ICD-10-CM | POA: Diagnosis not present

## 2019-01-24 DIAGNOSIS — E1165 Type 2 diabetes mellitus with hyperglycemia: Secondary | ICD-10-CM | POA: Diagnosis not present

## 2019-01-24 DIAGNOSIS — E785 Hyperlipidemia, unspecified: Secondary | ICD-10-CM | POA: Diagnosis not present

## 2019-01-24 DIAGNOSIS — K219 Gastro-esophageal reflux disease without esophagitis: Secondary | ICD-10-CM | POA: Diagnosis not present

## 2019-01-24 DIAGNOSIS — F325 Major depressive disorder, single episode, in full remission: Secondary | ICD-10-CM | POA: Diagnosis not present

## 2019-04-07 DIAGNOSIS — Z0000 Encounter for general adult medical examination without abnormal findings: Secondary | ICD-10-CM | POA: Diagnosis not present

## 2019-04-22 DIAGNOSIS — E119 Type 2 diabetes mellitus without complications: Secondary | ICD-10-CM | POA: Diagnosis not present

## 2019-04-22 DIAGNOSIS — Z794 Long term (current) use of insulin: Secondary | ICD-10-CM | POA: Diagnosis not present

## 2019-04-22 DIAGNOSIS — Z5181 Encounter for therapeutic drug level monitoring: Secondary | ICD-10-CM | POA: Diagnosis not present

## 2019-04-22 DIAGNOSIS — E669 Obesity, unspecified: Secondary | ICD-10-CM | POA: Diagnosis not present

## 2019-07-05 DIAGNOSIS — Z20828 Contact with and (suspected) exposure to other viral communicable diseases: Secondary | ICD-10-CM | POA: Diagnosis not present

## 2019-07-05 DIAGNOSIS — Z7189 Other specified counseling: Secondary | ICD-10-CM | POA: Diagnosis not present

## 2019-07-13 DIAGNOSIS — B342 Coronavirus infection, unspecified: Secondary | ICD-10-CM | POA: Diagnosis not present

## 2019-07-13 DIAGNOSIS — R05 Cough: Secondary | ICD-10-CM | POA: Diagnosis not present

## 2019-07-20 DIAGNOSIS — H2513 Age-related nuclear cataract, bilateral: Secondary | ICD-10-CM | POA: Diagnosis not present

## 2019-07-20 DIAGNOSIS — H353131 Nonexudative age-related macular degeneration, bilateral, early dry stage: Secondary | ICD-10-CM | POA: Diagnosis not present

## 2019-07-20 DIAGNOSIS — H3561 Retinal hemorrhage, right eye: Secondary | ICD-10-CM | POA: Diagnosis not present

## 2019-07-20 DIAGNOSIS — H40013 Open angle with borderline findings, low risk, bilateral: Secondary | ICD-10-CM | POA: Diagnosis not present

## 2019-10-17 DIAGNOSIS — E119 Type 2 diabetes mellitus without complications: Secondary | ICD-10-CM | POA: Diagnosis not present

## 2019-10-17 DIAGNOSIS — M2041 Other hammer toe(s) (acquired), right foot: Secondary | ICD-10-CM | POA: Diagnosis not present

## 2019-11-11 DIAGNOSIS — Z8639 Personal history of other endocrine, nutritional and metabolic disease: Secondary | ICD-10-CM | POA: Diagnosis not present

## 2019-11-11 DIAGNOSIS — Z79899 Other long term (current) drug therapy: Secondary | ICD-10-CM | POA: Diagnosis not present

## 2019-11-11 DIAGNOSIS — E119 Type 2 diabetes mellitus without complications: Secondary | ICD-10-CM | POA: Diagnosis not present

## 2019-11-11 DIAGNOSIS — E669 Obesity, unspecified: Secondary | ICD-10-CM | POA: Diagnosis not present

## 2019-11-11 DIAGNOSIS — Z794 Long term (current) use of insulin: Secondary | ICD-10-CM | POA: Diagnosis not present

## 2019-11-14 ENCOUNTER — Other Ambulatory Visit: Payer: Self-pay | Admitting: Obstetrics & Gynecology

## 2019-11-14 DIAGNOSIS — Z1231 Encounter for screening mammogram for malignant neoplasm of breast: Secondary | ICD-10-CM

## 2019-11-21 DIAGNOSIS — Z01419 Encounter for gynecological examination (general) (routine) without abnormal findings: Secondary | ICD-10-CM | POA: Diagnosis not present

## 2019-11-22 ENCOUNTER — Other Ambulatory Visit: Payer: Self-pay

## 2019-11-22 ENCOUNTER — Ambulatory Visit
Admission: RE | Admit: 2019-11-22 | Discharge: 2019-11-22 | Disposition: A | Discharge status: Home / Self Care | Payer: BLUE CROSS/BLUE SHIELD | Source: Ambulatory Visit | Attending: Obstetrics & Gynecology | Admitting: Obstetrics & Gynecology

## 2019-11-22 DIAGNOSIS — Z1231 Encounter for screening mammogram for malignant neoplasm of breast: Secondary | ICD-10-CM

## 2019-12-20 ENCOUNTER — Ambulatory Visit: Payer: BLUE CROSS/BLUE SHIELD

## 2019-12-22 DIAGNOSIS — L821 Other seborrheic keratosis: Secondary | ICD-10-CM | POA: Diagnosis not present

## 2019-12-22 DIAGNOSIS — D225 Melanocytic nevi of trunk: Secondary | ICD-10-CM | POA: Diagnosis not present

## 2019-12-22 DIAGNOSIS — R208 Other disturbances of skin sensation: Secondary | ICD-10-CM | POA: Diagnosis not present

## 2019-12-22 DIAGNOSIS — D1801 Hemangioma of skin and subcutaneous tissue: Secondary | ICD-10-CM | POA: Diagnosis not present

## 2019-12-22 DIAGNOSIS — L820 Inflamed seborrheic keratosis: Secondary | ICD-10-CM | POA: Diagnosis not present

## 2019-12-22 DIAGNOSIS — L570 Actinic keratosis: Secondary | ICD-10-CM | POA: Diagnosis not present

## 2019-12-22 DIAGNOSIS — L814 Other melanin hyperpigmentation: Secondary | ICD-10-CM | POA: Diagnosis not present

## 2020-01-12 DIAGNOSIS — U071 COVID-19: Secondary | ICD-10-CM | POA: Diagnosis not present

## 2020-01-23 DIAGNOSIS — H3561 Retinal hemorrhage, right eye: Secondary | ICD-10-CM | POA: Diagnosis not present

## 2020-01-23 DIAGNOSIS — H40013 Open angle with borderline findings, low risk, bilateral: Secondary | ICD-10-CM | POA: Diagnosis not present

## 2020-01-23 DIAGNOSIS — H2513 Age-related nuclear cataract, bilateral: Secondary | ICD-10-CM | POA: Diagnosis not present

## 2020-01-23 DIAGNOSIS — H353131 Nonexudative age-related macular degeneration, bilateral, early dry stage: Secondary | ICD-10-CM | POA: Diagnosis not present

## 2020-05-21 DIAGNOSIS — Z0000 Encounter for general adult medical examination without abnormal findings: Secondary | ICD-10-CM | POA: Diagnosis not present

## 2020-05-21 DIAGNOSIS — E785 Hyperlipidemia, unspecified: Secondary | ICD-10-CM | POA: Diagnosis not present

## 2020-05-21 DIAGNOSIS — Z23 Encounter for immunization: Secondary | ICD-10-CM | POA: Diagnosis not present

## 2020-05-21 DIAGNOSIS — E119 Type 2 diabetes mellitus without complications: Secondary | ICD-10-CM | POA: Diagnosis not present

## 2020-06-20 DIAGNOSIS — Z20822 Contact with and (suspected) exposure to covid-19: Secondary | ICD-10-CM | POA: Diagnosis not present

## 2020-07-31 DIAGNOSIS — E1165 Type 2 diabetes mellitus with hyperglycemia: Secondary | ICD-10-CM | POA: Diagnosis not present

## 2020-07-31 DIAGNOSIS — Z794 Long term (current) use of insulin: Secondary | ICD-10-CM | POA: Diagnosis not present

## 2020-08-14 DIAGNOSIS — E11649 Type 2 diabetes mellitus with hypoglycemia without coma: Secondary | ICD-10-CM | POA: Diagnosis not present

## 2020-08-14 DIAGNOSIS — E1165 Type 2 diabetes mellitus with hyperglycemia: Secondary | ICD-10-CM | POA: Diagnosis not present

## 2020-08-14 DIAGNOSIS — Z794 Long term (current) use of insulin: Secondary | ICD-10-CM | POA: Diagnosis not present

## 2020-08-14 DIAGNOSIS — Z79899 Other long term (current) drug therapy: Secondary | ICD-10-CM | POA: Diagnosis not present

## 2020-08-14 DIAGNOSIS — Z7984 Long term (current) use of oral hypoglycemic drugs: Secondary | ICD-10-CM | POA: Diagnosis not present

## 2020-08-17 DIAGNOSIS — E1165 Type 2 diabetes mellitus with hyperglycemia: Secondary | ICD-10-CM | POA: Diagnosis not present

## 2020-08-17 DIAGNOSIS — Z794 Long term (current) use of insulin: Secondary | ICD-10-CM | POA: Diagnosis not present

## 2020-10-08 DIAGNOSIS — E1165 Type 2 diabetes mellitus with hyperglycemia: Secondary | ICD-10-CM | POA: Diagnosis not present

## 2020-10-08 DIAGNOSIS — Z794 Long term (current) use of insulin: Secondary | ICD-10-CM | POA: Diagnosis not present

## 2020-10-15 DIAGNOSIS — Z79899 Other long term (current) drug therapy: Secondary | ICD-10-CM | POA: Diagnosis not present

## 2020-10-15 DIAGNOSIS — Z794 Long term (current) use of insulin: Secondary | ICD-10-CM | POA: Diagnosis not present

## 2020-10-15 DIAGNOSIS — Z23 Encounter for immunization: Secondary | ICD-10-CM | POA: Diagnosis not present

## 2020-10-15 DIAGNOSIS — Z7984 Long term (current) use of oral hypoglycemic drugs: Secondary | ICD-10-CM | POA: Diagnosis not present

## 2020-10-15 DIAGNOSIS — E1165 Type 2 diabetes mellitus with hyperglycemia: Secondary | ICD-10-CM | POA: Diagnosis not present

## 2020-10-25 DIAGNOSIS — Z20828 Contact with and (suspected) exposure to other viral communicable diseases: Secondary | ICD-10-CM | POA: Diagnosis not present

## 2020-12-21 DIAGNOSIS — L814 Other melanin hyperpigmentation: Secondary | ICD-10-CM | POA: Diagnosis not present

## 2020-12-21 DIAGNOSIS — D225 Melanocytic nevi of trunk: Secondary | ICD-10-CM | POA: Diagnosis not present

## 2020-12-21 DIAGNOSIS — B351 Tinea unguium: Secondary | ICD-10-CM | POA: Diagnosis not present

## 2020-12-21 DIAGNOSIS — L821 Other seborrheic keratosis: Secondary | ICD-10-CM | POA: Diagnosis not present
